# Patient Record
Sex: Female | Born: 2005 | Hispanic: Yes | Marital: Single | Smoking: Never smoker
Health system: Southern US, Community
[De-identification: ages and names within clinical notes are randomized; demographics above are authoritative.]

## PROBLEM LIST (undated history)

## (undated) DIAGNOSIS — D649 Anemia, unspecified: Secondary | ICD-10-CM

## (undated) DIAGNOSIS — J45909 Unspecified asthma, uncomplicated: Secondary | ICD-10-CM

## (undated) HISTORY — PX: NO PAST SURGERIES: SHX2092

## (undated) HISTORY — DX: Anemia, unspecified: D64.9

## (undated) HISTORY — DX: Unspecified asthma, uncomplicated: J45.909

---
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
## 2023-01-07 ENCOUNTER — Ambulatory Visit (INDEPENDENT_AMBULATORY_CARE_PROVIDER_SITE_OTHER): Payer: Medicaid Other

## 2023-01-07 VITALS — BP 103/69 | HR 88 | Wt 176.8 lb

## 2023-01-07 DIAGNOSIS — Z23 Encounter for immunization: Secondary | ICD-10-CM

## 2023-01-07 NOTE — Progress Notes (Cosign Needed Addendum)
Pt is in the office for Gardasil injection.  Administered in R Del and pt tolerated well.

## 2023-01-25 ENCOUNTER — Ambulatory Visit: Payer: Medicaid Other

## 2023-01-25 VITALS — BP 109/73 | HR 106 | Wt 177.0 lb

## 2023-01-25 DIAGNOSIS — Z3042 Encounter for surveillance of injectable contraceptive: Secondary | ICD-10-CM

## 2023-01-25 NOTE — Progress Notes (Signed)
 Pt is in the office for depo injection.  Administered in R Del and pt tolerated well. Next due March 18- April 1 .. Administrations This Visit     medroxyPROGESTERone  (DEPO-PROVERA ) injection 150 mg     Admin Date 01/25/2023 Action Given Dose 150 mg Route Intramuscular Documented By Doneta Laymon BIRCH, RN

## 2023-04-18 ENCOUNTER — Ambulatory Visit: Payer: Medicaid Other | Admitting: *Deleted

## 2023-04-18 DIAGNOSIS — Z3042 Encounter for surveillance of injectable contraceptive: Secondary | ICD-10-CM | POA: Diagnosis not present

## 2023-04-18 MED ORDER — MEDROXYPROGESTERONE ACETATE 150 MG/ML IM SUSP
150.0000 mg | Freq: Once | INTRAMUSCULAR | Status: AC
  Administered 2023-04-18: 150 mg via INTRAMUSCULAR

## 2023-04-18 MED ORDER — MEDROXYPROGESTERONE ACETATE 150 MG/ML IM SUSP: 150.0000 mg | INTRAMUSCULAR | 0 refills | Status: DC

## 2023-04-18 NOTE — Progress Notes (Signed)
 Date last pap: NA (visit 07/09/22). Last Depo-Provera: 01/25/23. Side Effects if any: NA. Serum HCG indicated? NA. Depo-Provera 150 mg IM given by: Montez Morita, RN in LD. Next appointment due 07/04/23-07/18/23. (Annual with Depo to be scheduled after 07/09/23)

## 2023-07-14 ENCOUNTER — Encounter

## 2023-07-14 MED ORDER — MEDROXYPROGESTERONE ACETATE 150 MG/ML IM SUSP: 150.0000 mg | INTRAMUSCULAR | 0 refills | Status: DC

## 2023-07-14 NOTE — Progress Notes (Unsigned)
 Pt not seen. Pt did not have any refills for depo. 1 refill sent. Pt to schedule annual, along with depo injection upon checkout.

## 2023-07-15 ENCOUNTER — Other Ambulatory Visit: Payer: Self-pay

## 2023-07-15 MED ORDER — MEDROXYPROGESTERONE ACETATE 150 MG/ML IM SUSP
150.0000 mg | INTRAMUSCULAR | 0 refills | Status: DC
Start: 2023-07-15 — End: 2023-07-25

## 2023-07-25 ENCOUNTER — Other Ambulatory Visit (HOSPITAL_COMMUNITY)
Admission: RE | Admit: 2023-07-25 | Discharge: 2023-07-25 | Disposition: A | Discharge status: Home / Self Care | Source: Ambulatory Visit | Attending: Obstetrics and Gynecology | Admitting: Obstetrics and Gynecology

## 2023-07-25 ENCOUNTER — Ambulatory Visit: Admitting: Obstetrics and Gynecology

## 2023-07-25 ENCOUNTER — Encounter: Payer: Self-pay | Admitting: Obstetrics and Gynecology

## 2023-07-25 VITALS — BP 139/88 | HR 84 | Ht 59.0 in | Wt 187.9 lb

## 2023-07-25 DIAGNOSIS — Z113 Encounter for screening for infections with a predominantly sexual mode of transmission: Secondary | ICD-10-CM

## 2023-07-25 DIAGNOSIS — Z8619 Personal history of other infectious and parasitic diseases: Secondary | ICD-10-CM

## 2023-07-25 DIAGNOSIS — Z3202 Encounter for pregnancy test, result negative: Secondary | ICD-10-CM

## 2023-07-25 DIAGNOSIS — Z01419 Encounter for gynecological examination (general) (routine) without abnormal findings: Secondary | ICD-10-CM

## 2023-07-25 DIAGNOSIS — Z758 Other problems related to medical facilities and other health care: Secondary | ICD-10-CM

## 2023-07-25 DIAGNOSIS — R635 Abnormal weight gain: Secondary | ICD-10-CM | POA: Diagnosis not present

## 2023-07-25 DIAGNOSIS — Z3042 Encounter for surveillance of injectable contraceptive: Secondary | ICD-10-CM

## 2023-07-25 LAB — POCT URINE PREGNANCY: Preg Test, Ur: NEGATIVE

## 2023-07-25 MED ORDER — MEDROXYPROGESTERONE ACETATE 150 MG/ML IM SUSP: 150.0000 mg | INTRAMUSCULAR | 0 refills | Status: AC

## 2023-07-25 MED ORDER — MEDROXYPROGESTERONE ACETATE 150 MG/ML IM SUSP
150.0000 mg | Freq: Once | INTRAMUSCULAR | Status: AC
  Administered 2023-07-25: 150 mg via INTRAMUSCULAR

## 2023-07-25 NOTE — Progress Notes (Signed)
 Pt presents for AEX and depo. Pt c/o excessive weight gain. Requesting weight management support and STD testing

## 2023-07-25 NOTE — Patient Instructions (Signed)
 [email or web address] -- more information about IUD

## 2023-07-25 NOTE — Progress Notes (Signed)
 ANNUAL EXAM Patient name: Donna Briggs MRN [id number]  Date of birth: 11-21-2005 Chief Complaint:   No chief complaint on file.  History of Present Illness:   Donna Briggs is a 18 y.o. G32P2002  female being seen today for a routine annual exam.  Current complaints: patient is using depo provera  for contraception. She reports weight gain that she feels she is having a hard time losing. She reports a tingling sensation in her hands.   No LMP recorded. Patient has had an injection.   The pregnancy intention screening data noted above was reviewed. Potential methods of contraception were discussed. The patient elected to proceed with No data recorded.   Gynecologic History Patient's last menstrual period was . Contraception: depo provera   Last Pap: n/a < 21 y.o. Last mammogram: n/a Gardasil: completed 3 dose series     07/09/2022    9:38 AM 04/22/2021    2:44 PM 02/11/2021    8:55 AM 10/02/2020   10:34 AM 06/05/2019    3:37 PM  Depression screen PHQ 2/9  Decreased Interest 0 0 0 0 0  Down, Depressed, Hopeless 0 0 0 0 0  PHQ - 2 Score 0 0 0 0 0  Altered sleeping 0 0 0 0 0  Tired, decreased energy 0 0 0 0 1  Change in appetite 0 0 0 0 0  Feeling bad or failure about yourself  0 0 0 0 0  Trouble concentrating 0 0 0 0 0  Moving slowly or fidgety/restless 0 0 0 0 0  Suicidal thoughts 0 0 0 0 0  PHQ-9 Score 0 0 0 0 1        07/09/2022    9:38 AM 04/22/2021    2:45 PM 10/02/2020   10:35 AM  GAD 7 : Generalized Anxiety Score  Nervous, Anxious, on Edge 0 0 0  Control/stop worrying 0 0 0  Worry too much - different things 0 0 0  Trouble relaxing 0 0 0  Restless 0 0 0  Easily annoyed or irritable 0 0 0  Afraid - awful might happen 0 0 0  Total GAD 7 Score 0 0 0     Review of Systems:   Pertinent items are noted in HPI Denies any headaches, blurred vision, fatigue, shortness of breath, chest pain, abdominal pain, abnormal vaginal  discharge/itching/odor/irritation, problems with periods, bowel movements, urination, or intercourse unless otherwise stated above. Pertinent History Reviewed:  Reviewed past medical,surgical, social and family history.  Reviewed problem list, medications and allergies. Physical Assessment:   Vitals:   07/25/23 1109  BP: 139/88  Pulse: 84  Weight: 187 lb 14.4 oz (85.2 kg)  Height: 4' 11 (1.499 m)  Body mass index is 37.95 kg/m.        Physical Examination:   General appearance - well appearing, and in no distress  Mental status - alert, oriented   Psych:  She has a normal mood and affect  Skin - warm and dry, normal color  Chest - effort normal, all lung fields clear to auscultation bilaterally, nontender  Heart - normal rate and regular rhythm  Neck:  midline trachea, no thyromegaly or nodules  Breasts - breasts appear normal, no suspicious masses, no skin or nipple changes or  axillary nodes  Abdomen - soft, nontender  Pelvic - deferred  Extremities:  No swelling or varicosities noted  Chaperone present for exam  No results found for this or any previous visit (from the past  24 hours).  Assessment & Plan:  1. Encounter for annual routine gynecological examination (Primary) Encourage routine breast exam UTD on gardasil Referral placed for primary care  Discussed PAP next year   2. Encounter for surveillance of injectable contraceptive Out of depo window, UPT neg today will do depo today  See below  3. Weight gain Discussed weight gain potential from depo, discussed other options such as nexplanon and IUD. Considering IUD. Will schedule IUD insertion in depo window otherwise schedule depo injections if desires to ocntinue  Discussed exercise 150 min weekly and dietary changes to aid in weight loss   4. Screen for STD (sexually transmitted disease)  - HIV antibody (with reflex) - RPR - Hepatitis C Antibody - Hepatitis B Surface AntiGEN - Cervicovaginal ancillary  only( [facility])  5. Does not have primary care provider -amb ref fam med  6. History of chlamydia Did not come for follow up visit, she completed regimen but partner did not. Discussed importance of medication completion from both parties and abstaining at least one week until both partners completed meds. Encouraged condom use   Labs/procedures today:   Mammogram: @ 20yo, or sooner if problems Colonoscopy: @ 20yo, or sooner if problems  Orders Placed This Encounter  Procedures   HIV antibody (with reflex)   RPR   Hepatitis C Antibody   Hepatitis B Surface AntiGEN    Meds:  Meds ordered this encounter  Medications   medroxyPROGESTERone  (DEPO-PROVERA ) 150 MG/ML injection    Sig: Inject 1 mL (150 mg total) into the muscle every 3 (three) months.    Dispense:  1 mL    Refill:  0   medroxyPROGESTERone  (DEPO-PROVERA ) injection 150 mg    Follow-up: Return 2 months IUD insertion, and 3 months nurse visit depo injection.   Nidia Daring, FNP

## 2023-07-26 ENCOUNTER — Ambulatory Visit: Payer: Self-pay | Admitting: Obstetrics and Gynecology

## 2023-07-26 LAB — HEPATITIS B SURFACE ANTIGEN: Hepatitis B Surface Ag: NEGATIVE

## 2023-07-26 LAB — HEPATITIS C ANTIBODY: Hep C Virus Ab: NONREACTIVE

## 2023-07-26 LAB — CERVICOVAGINAL ANCILLARY ONLY
Chlamydia: POSITIVE — AB
Comment: NEGATIVE
Comment: NEGATIVE
Comment: NORMAL
Neisseria Gonorrhea: NEGATIVE
Trichomonas: NEGATIVE

## 2023-07-26 LAB — HIV ANTIBODY (ROUTINE TESTING W REFLEX): HIV Screen 4th Generation wRfx: NONREACTIVE

## 2023-07-26 LAB — RPR: RPR Ser Ql: NONREACTIVE

## 2023-07-26 MED ORDER — DOXYCYCLINE HYCLATE 100 MG PO CAPS: 100.0000 mg | ORAL_CAPSULE | Freq: Two times a day (BID) | ORAL | 0 refills | Status: AC

## 2023-07-28 ENCOUNTER — Encounter: Payer: Self-pay | Admitting: Obstetrics and Gynecology

## 2023-07-28 DIAGNOSIS — A749 Chlamydial infection, unspecified: Secondary | ICD-10-CM | POA: Insufficient documentation

## 2023-08-23 NOTE — Progress Notes (Unsigned)
 New patient visit   Patient: Donna Briggs   DOB: 2005/11/15   18 y.o. Female  MRN: [id number] Visit Date: 08/24/2023  Today's healthcare provider: Manuelita Flatness, PA-C   No chief complaint on file.  Subjective    Donna Briggs is a 18 y.o. female who presents today as a new patient to establish care.   Recent chalmhydia, d oxy  Past Medical History:  Diagnosis Date   Alpha thalassemia silent carrier 06/19/2019   01/07/2021: pt declines genetic counseling.   Anemia    Asthma    Past Surgical History:  Procedure Laterality Date   NO PAST SURGERIES     Family Status  Relation Name Status   Mother  Alive   Father  Alive   Sister  (Not Specified)   Brother  (Not Specified)   Neg Hx  (Not Specified)  No partnership data on file   Family History  Problem Relation Age of Onset   Asthma Sister    Asthma Brother    Breast cancer Neg Hx    Colon cancer Neg Hx    Social History   Socioeconomic History   Marital status: Single    Spouse name: Not on file   Number of children: Not on file   Years of education: Not on file   Highest education level: Not on file  Occupational History   Occupation: Consulting civil engineer - High School  Tobacco Use   Smoking status: Never    Passive exposure: Never   Smokeless tobacco: Never  Vaping Use   Vaping status: Never Used  Substance and Sexual Activity   Alcohol use: Never   Drug use: Never   Sexual activity: Yes    Partners: Male    Birth control/protection: Injection  Other Topics Concern   Not on file  Social History Narrative   Not on file   Social Drivers of Health   Financial Resource Strain: Not on File (05/14/2021)   Received from [employer]    Financial Resource Strain: 0  Food Insecurity: Not on File (10/21/2022)   Received from [facility] Insecurity    Food: 0  Transportation Needs: Not on File (05/14/2021)   Received from [facility] [employer] Needs     Transportation: 0  Physical Activity: Not on File (05/14/2021)   [facility]   Physical Activity    Physical Activity: 0  Stress: Not on File (05/14/2021)   Received from [facility]   Stress    Stress: 0  Social Connections: Not on File (10/09/2022)   Received from [facility]   Social Connections    Connectedness: 0   Outpatient Medications Prior to Visit  Medication Sig   acetaminophen  (TYLENOL ) 500 MG tablet Take 2 tablets (1,000 mg total) by mouth every 8 (eight) hours as needed (pain). (Patient not taking: Reported on 01/07/2023)   ferrous sulfate  (FERROUSUL) 325 (65 FE) MG tablet Take 1 tablet (325 mg total) by mouth every other day. (Patient not taking: Reported on 04/18/2023)   ibuprofen  (ADVIL ) 600 MG tablet Take 1 tablet (600 mg total) by mouth every 6 (six) hours as needed. (Patient not taking: Reported on 04/18/2023)   medroxyPROGESTERone  (DEPO-PROVERA ) 150 MG/ML injection Inject 1 mL (150 mg total) into the muscle every 3 (three) months.   Prenatal MV & Min w/FA-DHA (PRENATAL GUMMIES PO) Take by mouth. (Patient not taking: Reported on 04/18/2023)   No facility-administered medications prior to visit.  No Known Allergies  Immunization History  Administered Date(s) Administered   HPV 9-valent 07/09/2022, 08/16/2022, 01/07/2023   Influenza,inj,Quad PF,6+ Mos 11/29/2019, 02/11/2021   PFIZER(Purple Top)SARS-COV-2 Vaccination 06/11/2019, 07/02/2019   Tdap 10/03/2019, 02/11/2021    Health Maintenance  Topic Date Due   Meningococcal B Vaccine (1 of 2 - Standard) Never done   Hepatitis B Vaccines (1 of 3 - 19+ 3-dose series) Never done   COVID-19 Vaccine (3 - 2024-25 season) 09/26/2022   INFLUENZA VACCINE  08/26/2023   CHLAMYDIA SCREENING  07/24/2024   DTaP/Tdap/Td (3 - Td or Tdap) 02/12/2031   HPV VACCINES  Completed   Hepatitis C Screening  Completed   HIV Screening  Completed    Patient Care Team: Pcp, No as PCP - General  Review of Systems  {Insert previous labs  (optional):23779} {See past labs  Heme  Chem  Endocrine  Serology  Results Review (optional):1}   Objective    There were no vitals taken for this visit. {Insert last BP/Wt (optional):23777}{See vitals history (optional):1}   Physical Exam ***  Depression Screen    07/09/2022    9:38 AM 04/22/2021    2:44 PM 02/11/2021    8:55 AM 10/02/2020   10:34 AM  PHQ 2/9 Scores  PHQ - 2 Score 0 0 0 0  PHQ- 9 Score 0 0 0 0   No results found for any visits on 08/24/23.  Assessment & Plan     There are no diagnoses linked to this encounter.   No follow-ups on file.      Manuelita Flatness, PA-C  [facility] at [facility] [phone] (phone) [phone] (fax)  [facility]

## 2023-08-24 ENCOUNTER — Encounter: Payer: Self-pay | Admitting: Physician Assistant

## 2023-08-24 ENCOUNTER — Ambulatory Visit (INDEPENDENT_AMBULATORY_CARE_PROVIDER_SITE_OTHER): Admitting: Physician Assistant

## 2023-08-24 VITALS — BP 96/64 | HR 74 | Ht 59.0 in | Wt 188.4 lb

## 2023-08-24 DIAGNOSIS — R0789 Other chest pain: Secondary | ICD-10-CM | POA: Diagnosis not present

## 2023-08-24 DIAGNOSIS — Z13220 Encounter for screening for lipoid disorders: Secondary | ICD-10-CM

## 2023-08-24 DIAGNOSIS — E669 Obesity, unspecified: Secondary | ICD-10-CM | POA: Diagnosis not present

## 2023-08-24 LAB — LIPID PANEL
Cholesterol: 163 mg/dL (ref 0–200)
HDL: 43.8 mg/dL (ref >39.00)
LDL Cholesterol: 96 mg/dL (ref 0–99)
NonHDL: 118.73
Total CHOL/HDL Ratio: 4
Triglycerides: 114 mg/dL (ref 0.0–149.0)
VLDL: 22.8 mg/dL (ref 0.0–40.0)

## 2023-08-24 LAB — CBC WITH DIFFERENTIAL/PLATELET
Basophils Absolute: 0.1 K/uL (ref 0.0–0.1)
Basophils Relative: 0.7 % (ref 0.0–3.0)
Eosinophils Absolute: 0.2 K/uL (ref 0.0–0.7)
Eosinophils Relative: 2.2 % (ref 0.0–5.0)
HCT: 40.2 % (ref 36.0–46.0)
Hemoglobin: 13 g/dL (ref 12.0–15.0)
Lymphocytes Relative: 26.1 % (ref 12.0–46.0)
Lymphs Abs: 2.8 K/uL (ref 0.7–4.0)
MCHC: 32.3 g/dL (ref 30.0–36.0)
MCV: 77.2 fl — ABNORMAL LOW (ref 78.0–100.0)
Monocytes Absolute: 0.7 K/uL (ref 0.1–1.0)
Monocytes Relative: 6.5 % (ref 3.0–12.0)
Neutro Abs: 6.9 K/uL (ref 1.4–7.7)
Neutrophils Relative %: 64.5 % (ref 43.0–77.0)
Platelets: 324 K/uL (ref 150.0–400.0)
RBC: 5.2 Mil/uL — ABNORMAL HIGH (ref 3.87–5.11)
RDW: 14.5 % (ref 11.5–14.6)
WBC: 10.6 K/uL — ABNORMAL HIGH (ref 4.5–10.5)

## 2023-08-24 LAB — COMPREHENSIVE METABOLIC PANEL WITH GFR
ALT: 9 U/L (ref 0–35)
AST: 11 U/L (ref 0–37)
Albumin: 4.8 g/dL (ref 3.5–5.2)
Alkaline Phosphatase: 93 U/L (ref 39–117)
BUN: 13 mg/dL (ref 6–23)
CO2: 25 meq/L (ref 19–32)
Calcium: 10.1 mg/dL (ref 8.4–10.5)
Chloride: 104 meq/L (ref 96–112)
Creatinine, Ser: 0.63 mg/dL (ref 0.40–1.20)
GFR: 127.56 mL/min (ref >60.00)
Glucose, Bld: 94 mg/dL (ref 70–99)
Potassium: 4.5 meq/L (ref 3.5–5.1)
Sodium: 138 meq/L (ref 135–145)
Total Bilirubin: 0.4 mg/dL (ref 0.2–1.2)
Total Protein: 7.6 g/dL (ref 6.0–8.3)

## 2023-08-24 LAB — HEMOGLOBIN A1C: Hgb A1c MFr Bld: 6 % (ref 4.6–6.5)

## 2023-08-24 LAB — TSH: TSH: 1.19 u[IU]/mL (ref 0.35–5.50)

## 2023-08-25 ENCOUNTER — Ambulatory Visit: Payer: Self-pay | Admitting: Physician Assistant

## 2023-08-25 DIAGNOSIS — R7303 Prediabetes: Secondary | ICD-10-CM | POA: Insufficient documentation

## 2023-09-07 ENCOUNTER — Encounter: Attending: Physician Assistant | Admitting: Skilled Nursing Facility1

## 2023-09-07 ENCOUNTER — Encounter: Payer: Self-pay | Admitting: Skilled Nursing Facility1

## 2023-09-07 DIAGNOSIS — R7303 Prediabetes: Secondary | ICD-10-CM | POA: Diagnosis present

## 2023-09-07 DIAGNOSIS — E669 Obesity, unspecified: Secondary | ICD-10-CM | POA: Diagnosis present

## 2023-09-07 NOTE — Progress Notes (Signed)
 Medical Nutrition Therapy  Appointment Start time:  11:08  Appointment End time:  12:08  Primary concerns today: weight loss   Referral diagnosis: e66.9   NUTRITION ASSESSMENT    Clinical Medical Hx: anemia, asthma Medications: see list Labs: A1C 6.0 Notable Signs/Symptoms: none reported   Lifestyle & Dietary Hx  Pt arrives with her child offering a significant barrier to education in the appt.  Pt states she has gained a lot of weight and does not know why thinking maybe it was the depo shot.  Pt states she lives with her parents and siblings.  Pt states she has 2 children with autism which will start therapy in January.  Pt states everyone grocery shops and makes their own meals int he household. 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  Pt states her daughter takes a nap but her son does not and they do not go to sleep until 1am and her son needs her to feed him so she does not eat and then when she goes to eat her son asks to be fed so she does not have a meal schedule for herself.  Estimated daily fluid intake:  oz Supplements:  Sleep: inconsistent and limited  Stress / self-care: 10/10 due to single mother of 2 children with autism not having had their neccessary therapies yet Current average weekly physical activity: ADL's  24-Hr Dietary Recall: wakes around 11am First Meal:  Snack:  Second Meal 2pm: fast food  Snack: chips Third Meal: pupusa  Snack: churo cheesecake  Beverages: caprisun, regular soda    NUTRITION INTERVENTION  Nutrition education (E-1) on the following topics:  Prediabetes: Prediabetes is a condition where blood sugar levels are higher than normal but not yet high enough to be diagnosed as type 2 diabetes. A1C, or hemoglobin A1c, is a blood test that provides an average of a person's blood sugar levels over the past two to three months. It is commonly used to diagnose and monitor diabetes. For prediabetes, an A1C level between 5.7% and 6.4%  typically is used to diagnose this. Here is how the A1C levels are generally categorized: Normal:  A1C below 5.7% Prediabetes:  A1C between 5.7% and 6.4% Diabetes:  A1C of 6.5% or higher When diagnosed with prediabetes, there are several lifestyle changes you can make to manage the condition: Healthy Eating:  Follow a well-balanced diet that includes a variety of fruits, vegetables, whole grains, lean proteins, and healthy fats. Monitor portion sizes and reduce intake of sugary and processed foods. Regular Physical Activity:  Engage in regular physical activity, such as brisk walking, cycling, or other aerobic exercises, for at least 150 minutes per week. Include strength training exercises at least twice a week. Weight Management: Achieve and maintain a healthy weight. Losing even a small amount of weight (3-5%) can significantly improve insulin sensitivity.   Handouts Provided Include  Detailed MyPlate  Learning Style & Readiness for Change Teaching method utilized: Visual & Auditory  Demonstrated degree of understanding via: Teach Back  Barriers to learning/adherence to lifestyle change: stress  Goals Established by Pt I will drink diet soda instead of regular soda I will drink 2 bottles of water per day   MONITORING & EVALUATION Dietary intake, weekly physical activity, Next Steps  Patient is to return in 4-6 weeks.

## 2023-09-22 ENCOUNTER — Encounter: Payer: Self-pay | Admitting: Obstetrics and Gynecology

## 2023-09-22 ENCOUNTER — Ambulatory Visit: Admitting: Obstetrics and Gynecology

## 2023-09-22 VITALS — BP 109/75 | HR 89 | Ht 59.0 in | Wt 188.6 lb

## 2023-09-22 DIAGNOSIS — Z3202 Encounter for pregnancy test, result negative: Secondary | ICD-10-CM

## 2023-09-22 DIAGNOSIS — Z30430 Encounter for insertion of intrauterine contraceptive device: Secondary | ICD-10-CM

## 2023-09-22 LAB — POCT URINE PREGNANCY: Preg Test, Ur: NEGATIVE

## 2023-09-22 MED ORDER — PARAGARD INTRAUTERINE COPPER IU IUD
1.0000 | INTRAUTERINE_SYSTEM | Freq: Once | INTRAUTERINE | Status: AC
  Administered 2023-09-22: 1 via INTRAUTERINE

## 2023-09-22 MED ORDER — IBUPROFEN 600 MG PO TABS: 600.0000 mg | ORAL_TABLET | Freq: Four times a day (QID) | ORAL | 0 refills | Status: AC | PRN

## 2023-09-22 NOTE — Patient Instructions (Signed)

## 2023-09-22 NOTE — Progress Notes (Signed)
 Pt presents for IUD insertion. Pt want Paragard    Pt still in Depo window, last unprotected sex 2 days ago

## 2023-09-22 NOTE — Progress Notes (Signed)
   GYNECOLOGY CLINIC PROCEDURE NOTE  Donna Briggs is a 18 y.o. [phone] here for Paragard  IUD insertion. No GYN concerns.  Last pap smear was n/a < 21  IUD Insertion Procedure Note Patient identified, informed consent performed, consent signed.   Discussed risks of irregular bleeding, cramping, infection, malpositioning or misplacement of the IUD outside the uterus which may require further procedure such as laparoscopy. Time out was performed.  Urine pregnancy test negative. She is currently in depo window   Speculum placed in the vagina.  Cervix visualized.  Cleaned with Betadine x 2. Uterus sounded to 6.5 cm.  Paragard  IUD placed per manufacturer's recommendations.  Strings trimmed to 3 cm.  Patient tolerated procedure well.   Patient was given post-procedure instructions.  She was advised to have backup contraception for one week.  Patient was also asked to check IUD strings periodically and follow up in 4 weeks for IUD check.  Rx ibuprofen  provided   Nidia Daring, FNP

## 2023-10-10 ENCOUNTER — Ambulatory Visit: Admitting: Skilled Nursing Facility1

## 2023-10-10 ENCOUNTER — Ambulatory Visit

## 2023-10-19 ENCOUNTER — Encounter: Payer: Self-pay | Admitting: Skilled Nursing Facility1

## 2023-10-19 ENCOUNTER — Encounter: Attending: Family Medicine | Admitting: Skilled Nursing Facility1

## 2023-10-19 VITALS — Wt 188.7 lb

## 2023-10-19 DIAGNOSIS — R7303 Prediabetes: Secondary | ICD-10-CM | POA: Insufficient documentation

## 2023-10-19 NOTE — Progress Notes (Signed)
 Medical Nutrition Therapy  Appointment Start time:  11:45  Appointment End time:  12:08  Primary concerns today: weight loss   Referral diagnosis: e66.9   NUTRITION ASSESSMENT   Clinical Medical Hx: anemia, asthma Medications: see list Labs: A1C 6.0 Notable Signs/Symptoms: none reported   Lifestyle & Dietary Hx  Pt states she has been eating 2 times a day.  Pt states for the last 2 weeks she feels dizzy about 3-4 times a day: advised to monitor this and contact her doctor. Pt states she has Stopped dirnking energy drinks.  Estimated daily fluid intake: 32 oz Supplements:  Sleep: inconsistent and limited  Stress / self-care: 10/10 due to single mother of 2 children with autism not having had their neccessary therapies yet Current average weekly physical activity: ADL's  24-Hr Dietary Recall: wakes around 11am First Meal:  Snack:  Second Meal 2pm: mushrooms and onions on steak + tortilla Snack:  Third Meal: soup (meat and veggie) Snack: churo cheesecake  Beverages: 2 caprisun, reduced regular soda, 32 ounces max of water    NUTRITION INTERVENTION  Nutrition education (E-1) on the following topics:  Prediabetes: Prediabetes is a condition where blood sugar levels are higher than normal but not yet high enough to be diagnosed as type 2 diabetes. A1C, or hemoglobin A1c, is a blood test that provides an average of a person's blood sugar levels over the past two to three months. It is commonly used to diagnose and monitor diabetes. For prediabetes, an A1C level between 5.7% and 6.4% typically is used to diagnose this. Here is how the A1C levels are generally categorized: Normal:  A1C below 5.7% Prediabetes:  A1C between 5.7% and 6.4% Diabetes:  A1C of 6.5% or higher When diagnosed with prediabetes, there are several lifestyle changes you can make to manage the condition: Healthy Eating:  Follow a well-balanced diet that includes a variety of fruits, vegetables, whole grains,  lean proteins, and healthy fats. Monitor portion sizes and reduce intake of sugary and processed foods. Regular Physical Activity:  Engage in regular physical activity, such as brisk walking, cycling, or other aerobic exercises, for at least 150 minutes per week. Include strength training exercises at least twice a week. Weight Management: Achieve and maintain a healthy weight. Losing even a small amount of weight (3-5%) can significantly improve insulin sensitivity. Purpose of hydration: Water makes up over 50% of your total body water, and is part of many organs throughout the body. Water is essential to transport digested nutrients, regulate body temperature, rid the body of waste products, and protects joints and the spinal cord. When not properly hydrated you will begin to experience headaches, cramps and dizziness. Further dehydration can result in rapid heart rate, shock, oliguria, and may cause seizures.  https://www.merckmanuals.com/home/hormonal-and-metabolic-disordehttps://www.usgs.gov/special-topic/water-science-school/science/water-you-water-and-human-body?qt-science_center_objects=0#qt-science_center_objectsrs/water-balance/about-body-water FriendLock.it InvestmentInstructor.com.cy FurEliminator.es https://www.health.VoiceTrader.com.au   Handouts Previously Provided Include  Detailed MyPlate  Learning Style & Readiness for Change Teaching method utilized: Visual & Auditory  Demonstrated degree of understanding via: Teach Back  Barriers to learning/adherence to lifestyle change: stress  Goals Established by Pt I will drink diet soda instead of regular soda: accomplished by reducing the amount of soda  I will drink 2 bottles of water per day: accomplished NEW: I will now aim for a minimum 6  bottles of water per day  NEW: I will chase/do what the kids are doing physically for 10 minutes per day   MONITORING & EVALUATION Dietary intake, weekly physical activity, Next Steps  Patient is to return in 4-6 weeks.

## 2023-10-20 ENCOUNTER — Ambulatory Visit

## 2023-10-20 ENCOUNTER — Ambulatory Visit (INDEPENDENT_AMBULATORY_CARE_PROVIDER_SITE_OTHER): Admitting: Obstetrics and Gynecology

## 2023-10-20 VITALS — BP 108/79 | HR 89 | Wt 189.0 lb

## 2023-10-20 DIAGNOSIS — Z30431 Encounter for routine checking of intrauterine contraceptive device: Secondary | ICD-10-CM | POA: Diagnosis not present

## 2023-10-20 NOTE — Progress Notes (Signed)
 Pt is in office for IUD check.  Pt complains of recent dizziness.

## 2023-10-20 NOTE — Progress Notes (Signed)
   ESTABLISHED GYNECOLOGY VISIT Chief Complaint  Patient presents with   Follow-up    Subjective:  Donna Briggs is a 18 y.o. [id number] presenting for Paragard  IUD check.  Inserted 09/22/2023 Had three weeks of bleeding and cramping Denies significant pelvic pain or heavy bleeding  Reports room spinning sensation with position changes  Review of Systems:   Pertinent items are noted in HPI  Pertinent History Reviewed:  Reviewed past medical,surgical, social and family history.  Reviewed problem list, medications and allergies.  Objective:   Vitals:   10/20/23 1048  BP: 108/79  Pulse: 89  Weight: 189 lb (85.7 kg)   Physical Examination:   General appearance - well appearing, and in no distress  Mental status - alert, oriented to person, place, and time  Psych:  normal mood and affect  Skin - warm and dry, normal color, no suspicious lesions noted  Abdomen - soft, nontender, nondistended, no masses or organomegaly  Pelvic -  VULVA: normal appearing vulva with no masses, tenderness or lesions   VAGINA: normal appearing vagina, no lesions , small blood tinge discharge CERVIX: normal appearing cervix without discharge or lesions, IUD strings normal length   Chaperone present for exam  Assessment and Plan:  1. IUD check up (Primary) Anticipatory guidance Reviewed string checks Annual follow up  Advised PCP follow up for vertigo symptoms   No follow-ups on file.  Future Appointments  Date Time Provider Department Center  11/17/2023  9:15 AM Scotece, Thersia NOVAK, RD [facility]    Rollo ONEIDA Bring, MD, FACOG Obstetrician & Gynecologist, [facility] for [facility], [facility]

## 2023-11-17 ENCOUNTER — Ambulatory Visit: Admitting: Skilled Nursing Facility1
